# Patient Record
Sex: Male | Born: 1980 | Race: Asian | Hispanic: No | Marital: Single | Smoking: Light tobacco smoker
Health system: Southern US, Community
[De-identification: ages and names within clinical notes are randomized; demographics above are authoritative.]

---

## 2003-01-10 HISTORY — PX: OPEN REDUCTION INTERNAL FIXATION (ORIF) TIBIA/FIBULA FRACTURE: SHX5992

## 2014-01-19 ENCOUNTER — Ambulatory Visit: Payer: 59 | Attending: Family Medicine | Admitting: Family Medicine

## 2014-01-19 ENCOUNTER — Encounter: Payer: Self-pay | Admitting: Family Medicine

## 2014-01-19 ENCOUNTER — Ambulatory Visit (HOSPITAL_COMMUNITY)
Admission: RE | Admit: 2014-01-19 | Discharge: 2014-01-19 | Disposition: A | Discharge status: Home / Self Care | Payer: 59 | Source: Ambulatory Visit | Attending: Family Medicine | Admitting: Family Medicine

## 2014-01-19 VITALS — BP 120/78 | HR 71 | Temp 97.7°F | Resp 16 | Ht 65.0 in | Wt 116.0 lb

## 2014-01-19 DIAGNOSIS — M19071 Primary osteoarthritis, right ankle and foot: Secondary | ICD-10-CM | POA: Diagnosis not present

## 2014-01-19 DIAGNOSIS — X58XXXS Exposure to other specified factors, sequela: Secondary | ICD-10-CM | POA: Insufficient documentation

## 2014-01-19 DIAGNOSIS — S82841S Displaced bimalleolar fracture of right lower leg, sequela: Secondary | ICD-10-CM | POA: Insufficient documentation

## 2014-01-19 DIAGNOSIS — T8589XA Other specified complication of internal prosthetic devices, implants and grafts, not elsewhere classified, initial encounter: Secondary | ICD-10-CM | POA: Diagnosis not present

## 2014-01-19 DIAGNOSIS — Z114 Encounter for screening for human immunodeficiency virus [HIV]: Secondary | ICD-10-CM | POA: Insufficient documentation

## 2014-01-19 DIAGNOSIS — Z8781 Personal history of (healed) traumatic fracture: Secondary | ICD-10-CM | POA: Insufficient documentation

## 2014-01-19 DIAGNOSIS — S91001S Unspecified open wound, right ankle, sequela: Secondary | ICD-10-CM | POA: Diagnosis not present

## 2014-01-19 DIAGNOSIS — Z967 Presence of other bone and tendon implants: Secondary | ICD-10-CM | POA: Insufficient documentation

## 2014-01-19 DIAGNOSIS — Z113 Encounter for screening for infections with a predominantly sexual mode of transmission: Secondary | ICD-10-CM | POA: Insufficient documentation

## 2014-01-19 DIAGNOSIS — S82891A Other fracture of right lower leg, initial encounter for closed fracture: Secondary | ICD-10-CM

## 2014-01-19 LAB — CBC WITH DIFFERENTIAL/PLATELET
BASOS PCT: 0 % (ref 0–1)
Basophils Absolute: 0 10*3/uL (ref 0.0–0.1)
Eosinophils Absolute: 0.1 10*3/uL (ref 0.0–0.7)
Eosinophils Relative: 1 % (ref 0–5)
HCT: 47.3 % (ref 39.0–52.0)
Hemoglobin: 15.6 g/dL (ref 13.0–17.0)
Lymphocytes Relative: 29 % (ref 12–46)
Lymphs Abs: 2.1 10*3/uL (ref 0.7–4.0)
MCH: 30.6 pg (ref 26.0–34.0)
MCHC: 33 g/dL (ref 30.0–36.0)
MCV: 92.9 fL (ref 78.0–100.0)
MONO ABS: 0.8 10*3/uL (ref 0.1–1.0)
MPV: 10.6 fL (ref 8.6–12.4)
Monocytes Relative: 11 % (ref 3–12)
Neutro Abs: 4.4 10*3/uL (ref 1.7–7.7)
Neutrophils Relative %: 59 % (ref 43–77)
Platelets: 295 10*3/uL (ref 150–400)
RBC: 5.09 MIL/uL (ref 4.22–5.81)
RDW: 13.4 % (ref 11.5–15.5)
WBC: 7.4 10*3/uL (ref 4.0–10.5)

## 2014-01-19 LAB — COMPLETE METABOLIC PANEL WITH GFR
ALT: 13 U/L (ref 0–53)
AST: 19 U/L (ref 0–37)
Albumin: 4.2 g/dL (ref 3.5–5.2)
Alkaline Phosphatase: 48 U/L (ref 39–117)
BUN: 9 mg/dL (ref 6–23)
CO2: 33 mEq/L — ABNORMAL HIGH (ref 19–32)
CREATININE: 0.94 mg/dL (ref 0.50–1.35)
Calcium: 9.7 mg/dL (ref 8.4–10.5)
Chloride: 102 mEq/L (ref 96–112)
GFR, Est African American: >89 mL/min
GFR, Est Non African American: >89 mL/min
Glucose, Bld: 89 mg/dL (ref 70–99)
POTASSIUM: 3.9 meq/L (ref 3.5–5.3)
Sodium: 141 mEq/L (ref 135–145)
Total Bilirubin: 0.7 mg/dL (ref 0.2–1.2)
Total Protein: 7.4 g/dL (ref 6.0–8.3)

## 2014-01-19 NOTE — Patient Instructions (Addendum)
Mr. Cyndie Chimeguyen,  Thank you for coming in today. It was a pleasure meeting you. I look forward to being your primary doctor.  1. R ankle: wound with exposed surgical hardware Labs to rule out infection/risk of infection CMP, CBC, screening HIV  Complete x-ray of Rt ankle- [facility] or [facility] Urgent ortho referral - you will be called with appt details.  My office will obtain your medical records from [place].  Patient signed release for medical information in office.   F/u with me in 3-4 weeks  Dr. Armen PickupFunches
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
## 2014-01-19 NOTE — Assessment & Plan Note (Addendum)
1. R ankle: wound with exposed surgical hardware Labs to rule out infection/risk of infection CMP, CBC, screening HIV  Complete x-ray of Rt ankle- [facility] or [facility] Urgent ortho referral - you will be called with appt details.  My office will obtain your medical records from [place].  Patient signed release for medical information in office.   F/u with me in 3-4 weeks  Addendum: Reviewed x-ray, osteomyelitis cannot be excluded although patient w/o fever, significant pain or soft tissue swelling to suggest osteo Plan: Plan for f/u CT of ankle since patient has hardware precluding MRI. This will need to be scheduled.  F/u labs  sed rate, CRP and blood cultures also ordered.

## 2014-01-19 NOTE — Progress Notes (Signed)
Pt is here to establish care. Pt had surgery on his right ankle and he is now has occasional pain. Pt is requesting to see a podiatrist.

## 2014-01-19 NOTE — Addendum Note (Signed)
Addended by: Dessa PhiFUNCHES, Alverda Nazzaro on: 01/19/2014 02:04 PM   Modules accepted: Orders

## 2014-01-19 NOTE — Assessment & Plan Note (Signed)
Screening HIV  

## 2014-01-20 ENCOUNTER — Telehealth: Payer: Self-pay | Admitting: *Deleted

## 2014-01-20 LAB — HIV ANTIBODY (ROUTINE TESTING W REFLEX): HIV 1&2 Ab, 4th Generation: NONREACTIVE

## 2014-01-20 NOTE — Telephone Encounter (Signed)
Pt aware of lab results. Transfer to front office to schedule Lab appointment

## 2014-01-20 NOTE — Telephone Encounter (Signed)
-----   Message from Lora PaulaJosalyn C Funches, MD sent at 01/19/2014  2:04 PM EST ----- Patient will need to come back for f/u labs.

## 2014-01-20 NOTE — Telephone Encounter (Signed)
-----   Message from Lora PaulaJosalyn C Funches, MD sent at 01/20/2014  9:41 AM EST ----- Normal CBC with diff and CMP. Screening HIV negative

## 2014-01-20 NOTE — Telephone Encounter (Signed)
-----   Message from Lora PaulaJosalyn C Funches, MD sent at 01/19/2014  2:00 PM EST ----- Reviewed x-ray. Osteomyelitis (infection of the bone) possible based on x-ray. Plan for f/u CT of ankle since patient has hardware precluding MRI. This will need to be scheduled.  F/u labs CBC, sed rate, CRP and blood cultures also ordered.

## 2014-01-21 ENCOUNTER — Ambulatory Visit: Payer: 59 | Attending: Family Medicine

## 2014-01-21 DIAGNOSIS — S82891A Other fracture of right lower leg, initial encounter for closed fracture: Secondary | ICD-10-CM

## 2014-01-22 LAB — C-REACTIVE PROTEIN: CRP: <0.5 mg/dL (ref <0.60)

## 2014-01-27 ENCOUNTER — Telehealth: Payer: Self-pay | Admitting: Family Medicine

## 2014-01-27 ENCOUNTER — Telehealth: Payer: Self-pay | Admitting: *Deleted

## 2014-01-27 LAB — CULTURE, BLOOD (SINGLE)
ORGANISM ID, BACTERIA: NO GROWTH
Organism ID, Bacteria: NO GROWTH

## 2014-01-27 NOTE — Telephone Encounter (Signed)
Left voice message with normal lab If any question return call 

## 2014-01-27 NOTE — Telephone Encounter (Signed)
-----   Message from Lora PaulaJosalyn C Funches, MD sent at 01/27/2014  8:02 AM EST ----- Neg blood cultures

## 2014-01-27 NOTE — Telephone Encounter (Signed)
Pt aware of lab results 

## 2014-01-27 NOTE — Telephone Encounter (Signed)
Pt returning call regarding results. Please f/u with pt. °

## 2014-01-27 NOTE — Telephone Encounter (Signed)
-----   Message from Lora PaulaJosalyn C Funches, MD sent at 01/22/2014 12:56 PM EST ----- Normal CRP

## 2014-02-02 ENCOUNTER — Encounter: Payer: Self-pay | Admitting: Family Medicine

## 2014-02-02 ENCOUNTER — Ambulatory Visit (INDEPENDENT_AMBULATORY_CARE_PROVIDER_SITE_OTHER): Payer: 59 | Admitting: Family Medicine

## 2014-02-02 VITALS — BP 141/86 | HR 80 | Ht 65.0 in | Wt 116.0 lb

## 2014-02-02 DIAGNOSIS — S82891A Other fracture of right lower leg, initial encounter for closed fracture: Secondary | ICD-10-CM

## 2014-02-02 DIAGNOSIS — M869 Osteomyelitis, unspecified: Secondary | ICD-10-CM

## 2014-02-02 NOTE — Assessment & Plan Note (Signed)
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 Certainly the year-long drainage is suspect. We'll refer him to orthopedics. I will let them decide on imaging and/or antibiotic therapy.
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
